# Patient Record
Sex: Female | Born: 2005 | Race: Black or African American | Hispanic: No | Marital: Single | Smoking: Never smoker
Health system: Southern US, Community
[De-identification: ages and names within clinical notes are randomized; demographics above are authoritative.]

## PROBLEM LIST (undated history)

## (undated) HISTORY — PX: MOUTH SURGERY: SHX715

---
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
## 2023-03-27 ENCOUNTER — Telehealth: Payer: Self-pay | Admitting: Internal Medicine

## 2023-03-27 NOTE — Telephone Encounter (Signed)
Good afternoon Dr. Leonides Schanz  The following patient is coming to [place] for vomiting and stomach pain since adolescence. She has seen [facility] for an OV once and is looking to transfer because as a child she wasn't given much help. She has requested you as her provider. Records are available on Epic. Please review and advise of scheduling. Thank you

## 2023-03-27 NOTE — Telephone Encounter (Signed)
Dr Leonides Schanz  Is it okay for her to schedule with an APP for an earlier appointment since your first available is in November?

## 2023-04-04 ENCOUNTER — Ambulatory Visit: Payer: Medicaid Other | Admitting: Physician Assistant

## 2023-06-12 ENCOUNTER — Ambulatory Visit: Payer: Medicaid Other | Admitting: Physician Assistant

## 2023-06-12 ENCOUNTER — Encounter: Payer: Self-pay | Admitting: Physician Assistant

## 2023-06-12 VITALS — BP 120/60 | HR 62 | Ht 62.0 in | Wt 122.0 lb

## 2023-06-12 DIAGNOSIS — R630 Anorexia: Secondary | ICD-10-CM | POA: Diagnosis not present

## 2023-06-12 DIAGNOSIS — R1013 Epigastric pain: Secondary | ICD-10-CM

## 2023-06-12 DIAGNOSIS — R112 Nausea with vomiting, unspecified: Secondary | ICD-10-CM | POA: Diagnosis not present

## 2023-06-12 DIAGNOSIS — R634 Abnormal weight loss: Secondary | ICD-10-CM

## 2023-06-12 MED ORDER — ONDANSETRON HCL 4 MG PO TABS: 4.0000 mg | ORAL_TABLET | Freq: Three times a day (TID) | ORAL | 2 refills | Status: AC | PRN

## 2023-06-12 NOTE — Progress Notes (Signed)
Chief Complaint: Abdominal pain and vomiting  HPI:    Erika Coffey is a 17 year old African-American female with a past medical history as listed below, accepted by Dr. Leonides Schanz, who was referred to me by [facility] for a complaint of abdominal pain and vomiting.      01/14/2022 abdominal x-ray with nonspecific bowel gas pattern and small stool burden.    Today, patient presents to clinic with her mother who does assist with history.  They explain that she has had some issues with nausea and vomiting seemingly randomly over the past year and a half or so, initially this happened in April 2023 and was so severe that she could only suck on ice cubes without vomiting, even water made her vomit for a 2-week time span, ever since then she has had issues with off-and-on nausea and vomiting, had another severe episode in the fall 2023.  Apparently has been to the ER on multiple occasions and they have tried her on various medicines including Zofran, Phenergan and Reglan, not currently on anything now.  Tells me that she continues with nausea which seems to get worse on certain days and an epigastric abdominal pain that hurts even when someone puts pressure on her stomach.  Also occasionally heartburn and reflux.  Associated symptoms include a loss of appetite.  Also about a 20 pound weight loss over the past year.  Was having issues with constipation but is now regular.    Denies fever, chills or stool.  History reviewed. No pertinent past medical history.  Past Surgical History:  Procedure Laterality Date   MOUTH SURGERY      Current Outpatient Medications  Medication Sig Dispense Refill   ondansetron (ZOFRAN) 4 MG tablet Take 1 tablet (4 mg total) by mouth every 8 (eight) hours as needed for nausea or vomiting. 30 tablet 2   No current facility-administered medications for this visit.    Allergies as of 06/12/2023   (No Known Allergies)    Family History  Problem Relation Age of  Onset   Thyroid disease Mother    Diabetes Maternal Grandmother    COPD Maternal Grandmother    Heart failure Maternal Grandmother    Colon polyps Paternal Grandfather     Social History   Socioeconomic History   Marital status: Single    Spouse name: Not on file   Number of children: Not on file   Years of education: Not on file   Highest education level: Not on file  Occupational History   Not on file  Tobacco Use   Smoking status: Former    Types: Cigarettes   Smokeless tobacco: Never  Substance and Sexual Activity   Alcohol use: No   Drug use: No   Sexual activity: Not on file  Other Topics Concern   Not on file  Social History Narrative   Not on file   Social Determinants of Health   Financial Resource Strain: Not on File (02/24/2022)   Received from [employer], [employer]    Financial Resource Strain: 0  Food Insecurity: Not on File (05/31/2023)   Received from [facility] Insecurity    Food: 0  Transportation Needs: Not on File (02/24/2022)   Received from [place], [employer] Needs    Transportation: 0  Physical Activity: Not on File (02/24/2022)   Received from [place], [place]   Physical Activity    Physical Activity: 0  Stress: Not  on File (02/24/2022)   Received from [facility], [place]   Stress    Stress: 0  Social Connections: Not on File (05/19/2023)   Received from [employer]   Social Connections    Connectedness: 0  Intimate Partner Violence: Not on file    Review of Systems:    Constitutional: No fever or chills Skin: No rash  Cardiovascular: No chest pain Respiratory: No SOB Gastrointestinal: See HPI and otherwise negative Genitourinary: No dysuria Neurological: No headache, dizziness or syncope Musculoskeletal: No new muscle or joint pain Hematologic: No bleeding Psychiatric: No history of depression or anxiety   Physical Exam:  Vital signs: BP 120/60   Pulse 62   Ht 5\' 2"  (1.575 m)   Wt 122 lb (55.3 kg)   BMI  22.31 kg/m   Constitutional:   Pleasant AA female appears to be in NAD, Well developed, Well nourished, alert and cooperative Head:  Normocephalic and atraumatic. Eyes:   PEERL, EOMI. No icterus. Conjunctiva pink. Ears:  Normal auditory acuity. Neck:  Supple Throat: Oral cavity and pharynx without inflammation, swelling or lesion.  Respiratory: Respirations even and unlabored. Lungs clear to auscultation bilaterally.   No wheezes, crackles, or rhonchi.  Cardiovascular: Normal S1, S2. No MRG. Regular rate and rhythm. No peripheral edema, cyanosis or pallor.  Gastrointestinal:  Soft, nondistended, mild epigastric ttp. No rebound or guarding. Normal bowel sounds. No appreciable masses or hepatomegaly. Rectal:  Not performed.  Msk:  Symmetrical without gross deformities. Without edema, no deformity or joint abnormality.  Neurologic:  Alert and  oriented x4;  grossly normal neurologically.  Skin:   Dry and intact without significant lesions or rashes. Psychiatric: Demonstrates good judgement and reason without abnormal affect or behaviors.  RELEVANT LABS AND IMAGING: CBC    Component Value Date/Time   WBC 9.1 01/14/2022 1405   RBC 5.17 (H) 01/14/2022 1405   HGB 13.2 01/14/2022 1405   HGB 13.2 01/11/2022 1357   HCT 41.1 01/14/2022 1405   HCT 39.5 01/11/2022 1357   PLT 256 01/14/2022 1405   PLT 228 01/11/2022 1357   MCV 79.5 (L) 01/14/2022 1405   MCV 77 (L) 01/11/2022 1357   MCH 25.5 (L) 01/14/2022 1405   MCHC 32.1 01/14/2022 1405   RDW 13.2 01/14/2022 1405   RDW 13.6 01/11/2022 1357   LYMPHSABS 1.9 01/14/2022 1405   LYMPHSABS 0.5 (L) 01/11/2022 1357   MONOABS 0.9 01/14/2022 1405   EOSABS 0.0 01/14/2022 1405   EOSABS 0.0 01/11/2022 1357   BASOSABS 0.1 01/14/2022 1405   BASOSABS 0.0 01/11/2022 1357    CMP     Component Value Date/Time   NA 134 (L) 01/14/2022 1405   NA 136 01/11/2022 1357   K 3.4 (L) 01/14/2022 1405   CL 103 01/14/2022 1405   CO2 19 (L) 01/14/2022 1405    GLUCOSE 81 01/14/2022 1405   BUN 14 01/14/2022 1405   BUN 10 01/11/2022 1357   CREATININE 0.83 01/14/2022 1405   CALCIUM 9.3 01/14/2022 1405   PROT 8.4 (H) 01/14/2022 1405   PROT 7.9 01/11/2022 1357   ALBUMIN 4.6 01/14/2022 1405   ALBUMIN 4.7 01/11/2022 1357   AST 19 01/14/2022 1405   ALT 15 01/14/2022 1405   ALKPHOS 47 01/14/2022 1405   BILITOT 1.3 (H) 01/14/2022 1405   BILITOT 0.3 01/11/2022 1357   GFRNONAA >60 01/14/2022 1405    Assessment: 1.  Nausea and vomiting: Initially severe nausea and vomiting which recurred a few months later, now off and on with  epigastric pain, no real help from Reglan, Phenergan, Zofran in the past ; consider gastroparesis versus gastritis versus functional dyspepsia versus other  2.  Epigastric pain: With above, consider gastroparesis versus gastritis versus other 3.  Weight loss: 20 pounds over the past year related to above 4.  Appetite loss  Plan: 1.  Due to chronicity of issues and no real diagnosis scheduled patient for diagnostic EGD in the [facility] with Dr. Leonides Schanz per her request.  Did provide the patient a detailed list of risks for the procedure and she agrees to proceed. Patient is appropriate for endoscopic procedure(s) in the ambulatory ([facility]) setting.  2.  Prescribed Zofran 4 mg pills every 4-6 hours as needed for nausea #30 with 1 refill. 3.  Patient will need to follow-up after time of EGD to discuss therapy and findings, allow Dr. Leonides Schanz to schedule this after time of EGD. 4.  Patient to follow in clinic per recommendations after time of procedure.  Hyacinth Meeker, PA-C [facility] 06/12/2023, 9:32 AM  Cc: [facility]

## 2023-06-12 NOTE — Patient Instructions (Signed)
We have sent the following medications to your pharmacy for you to pick up at your convenience:  Zofran  You have been scheduled for an endoscopy. Please follow written instructions given to you at your visit today.  If you use inhalers (even only as needed), please bring them with you on the day of your procedure.  If you take any of the following medications, they will need to be adjusted prior to your procedure:   DO NOT TAKE 7 DAYS PRIOR TO TEST- Trulicity (dulaglutide) Ozempic, Wegovy (semaglutide) Mounjaro (tirzepatide) Bydureon Bcise (exanatide extended release)  DO NOT TAKE 1 DAY PRIOR TO YOUR TEST Rybelsus (semaglutide) Adlyxin (lixisenatide) Victoza (liraglutide) Byetta (exanatide) ___________________________________________________________________________

## 2023-06-13 NOTE — Progress Notes (Signed)
I agree with the assessment and plan as outlined by Ms. Lemmon. 

## 2023-06-15 ENCOUNTER — Encounter: Payer: Self-pay | Admitting: Internal Medicine

## 2023-06-15 ENCOUNTER — Ambulatory Visit: Payer: Medicaid Other | Admitting: Internal Medicine

## 2023-06-15 VITALS — BP 127/72 | HR 58 | Temp 98.4°F | Resp 24 | Ht 62.0 in | Wt 122.0 lb

## 2023-06-15 DIAGNOSIS — R1013 Epigastric pain: Secondary | ICD-10-CM | POA: Diagnosis not present

## 2023-06-15 DIAGNOSIS — K2980 Duodenitis without bleeding: Secondary | ICD-10-CM

## 2023-06-15 DIAGNOSIS — R112 Nausea with vomiting, unspecified: Secondary | ICD-10-CM | POA: Diagnosis not present

## 2023-06-15 DIAGNOSIS — R634 Abnormal weight loss: Secondary | ICD-10-CM

## 2023-06-15 MED ORDER — SODIUM CHLORIDE 0.9 % IV SOLN: 500.0000 mL | Freq: Once | INTRAVENOUS | Status: DC

## 2023-06-15 MED ORDER — OMEPRAZOLE 40 MG PO CPDR: 40.0000 mg | DELAYED_RELEASE_CAPSULE | Freq: Every day | ORAL | 3 refills | Status: DC

## 2023-06-15 NOTE — Progress Notes (Signed)
GASTROENTEROLOGY PROCEDURE H&P NOTE   Primary Care Physician: [facility]    Reason for Procedure:   N&V, epigastric ab pain, weight loss  Plan:    EGD  Patient is appropriate for endoscopic procedure(s) in the ambulatory ([facility]) setting.  The nature of the procedure, as well as the risks, benefits, and alternatives were carefully and thoroughly reviewed with the patient. Ample time for discussion and questions allowed. The patient understood, was satisfied, and agreed to proceed.     HPI: Erika Coffey is a 17 y.o. female who presents for EGD for evaluation of N&V, epigastric ab pain, and weight loss .  Patient was most recently seen in the Gastroenterology Clinic on 06/12/23.  No interval change in medical history since that appointment. Patient states that her last use of marijuana was in the beginning of this year. Please refer to that note for full details regarding GI history and clinical presentation.   History reviewed. No pertinent past medical history.  Past Surgical History:  Procedure Laterality Date   MOUTH SURGERY      Prior to Admission medications   Medication Sig Start Date End Date Taking? Authorizing Provider  ondansetron (ZOFRAN) 4 MG tablet Take 1 tablet (4 mg total) by mouth every 8 (eight) hours as needed for nausea or vomiting. Patient not taking: Reported on 06/15/2023 06/12/23   Unk Lightning, PA    Current Outpatient Medications  Medication Sig Dispense Refill   ondansetron (ZOFRAN) 4 MG tablet Take 1 tablet (4 mg total) by mouth every 8 (eight) hours as needed for nausea or vomiting. (Patient not taking: Reported on 06/15/2023) 30 tablet 2   Current Facility-Administered Medications  Medication Dose Route Frequency Provider Last Rate Last Admin   0.9 %  sodium chloride infusion  500 mL Intravenous Once Imogene Burn, MD        Allergies as of 06/15/2023   (No Known Allergies)    Family History   Problem Relation Age of Onset   Thyroid disease Mother    Diabetes Maternal Grandmother    COPD Maternal Grandmother    Heart failure Maternal Grandmother    Colon polyps Paternal Grandfather     Social History   Socioeconomic History   Marital status: Single    Spouse name: Not on file   Number of children: Not on file   Years of education: Not on file   Highest education level: Not on file  Occupational History   Not on file  Tobacco Use   Smoking status: Former    Types: Cigarettes   Smokeless tobacco: Never  Substance and Sexual Activity   Alcohol use: No   Drug use: No   Sexual activity: Not on file  Other Topics Concern   Not on file  Social History Narrative   Not on file   Social Determinants of Health   Financial Resource Strain: Not on File (02/24/2022)   Received from [employer], [employer]    Financial Resource Strain: 0  Food Insecurity: Not on File (05/31/2023)   Received from [facility] Insecurity    Food: 0  Transportation Needs: Not on File (02/24/2022)   Received from [place], [employer] Needs    Transportation: 0  Physical Activity: Not on File (02/24/2022)   Received from [place], [place]   Physical Activity    Physical Activity: 0  Stress: Not on File (02/24/2022)   Received from  [facility], [place]   Stress    Stress: 0  Social Connections: Not on File (05/19/2023)   Received from [facility]   Social Connections    Connectedness: 0  Intimate Partner Violence: Not on file    Physical Exam: Vital signs in last 24 hours: BP (!) 106/55   Pulse 60   Temp 98.4 F (36.9 C) (Temporal)   Ht 5\' 2"  (1.575 m)   Wt 122 lb (55.3 kg)   LMP 06/09/2023 (Approximate)   SpO2 99%   BMI 22.31 kg/m  GEN: NAD EYE: Sclerae anicteric ENT: MMM CV: Non-tachycardic Pulm: No increased WOB GI: Soft NEURO:  Alert & Oriented   Eulah Pont, MD [facility]   06/15/2023 9:48 AM

## 2023-06-15 NOTE — Progress Notes (Signed)
Report given to PACU, vss 

## 2023-06-15 NOTE — Progress Notes (Signed)
Called to room to assist during endoscopic procedure.  Patient ID and intended procedure confirmed with present staff. Received instructions for my participation in the procedure from the performing physician.  

## 2023-06-15 NOTE — Progress Notes (Signed)
Pt's states no medical or surgical changes since previsit or office visit. 

## 2023-06-15 NOTE — Op Note (Signed)
[facility] Patient Name: Erika Coffey Procedure Date: 06/15/2023 9:57 AM MRN: [id number] Endoscopist: Particia Lather , , [id number] Age: 17 Referring MD:  Date of Birth: 03-11-2006 Gender: Female Account #: [id number] Procedure:                Upper GI endoscopy Indications:              Epigastric abdominal pain, Nausea with vomiting,                            Weight loss Medicines:                Monitored Anesthesia Care Procedure:                Pre-Anesthesia Assessment:                           - Prior to the procedure, a History and Physical                            was performed, and patient medications and                            allergies were reviewed. The patient's tolerance of                            previous anesthesia was also reviewed. The risks                            and benefits of the procedure and the sedation                            options and risks were discussed with the patient.                            All questions were answered, and informed consent                            was obtained. Prior Anticoagulants: The patient has                            taken no anticoagulant or antiplatelet agents. ASA                            Grade Assessment: I - A normal, healthy patient.                            After reviewing the risks and benefits, the patient                            was deemed in satisfactory condition to undergo the                            procedure.  After obtaining informed consent, the endoscope was                            passed under direct vision. Throughout the                            procedure, the patient's blood pressure, pulse, and                            oxygen saturations were monitored continuously. The                            GIF [id number] #[id number] was introduced through the                            mouth, and advanced to the second part of  duodenum.                            The upper GI endoscopy was accomplished without                            difficulty. The patient tolerated the procedure                            well. Scope In: Scope Out: Findings:                 White nummular lesions were noted in the mid                            esophagus and in the distal esophagus. Biopsies                            were taken with a cold forceps for histology.                           Retained fluid was found in the gastric body.                           Localized mildly erythematous mucosa without                            bleeding was found in the gastric antrum. Biopsies                            were taken with a cold forceps for histology.                           The examined duodenum was normal. Biopsies were                            taken with a cold forceps for histology. Complications:            No immediate complications. Estimated Blood Loss:     Estimated blood loss was minimal.  Impression:               - White nummular lesions in esophageal mucosa.                            Biopsied.                           - Retained gastric fluid.                           - Erythematous mucosa in the antrum. Biopsied.                           - Normal examined duodenum. Biopsied. Recommendation:           - Discharge patient to home (with escort).                           - Await pathology results.                           - Use Prilosec (omeprazole) 40 mg PO daily.                           - Return to GI clinic in 2-3 months.                           - The findings and recommendations were discussed                            with the patient. Dr Particia Lather "Alan Ripper" Leonides Schanz,  06/15/2023 10:12:06 AM

## 2023-06-15 NOTE — Progress Notes (Signed)
0957 Robinul 0.1 mg IV given due large amount of secretions upon assessment.  MD made aware, vss

## 2023-06-15 NOTE — Patient Instructions (Signed)
Prescription sent for omeprazole 40 mg daily to preferred pharmacy.    YOU HAD AN ENDOSCOPIC PROCEDURE TODAY AT THE [facility]:   Refer to the procedure report that was given to you for any specific questions about what was found during the examination.  If the procedure report does not answer your questions, please call your gastroenterologist to clarify.  If you requested that your care partner not be given the details of your procedure findings, then the procedure report has been included in a sealed envelope for you to review at your convenience later.  YOU SHOULD EXPECT: Some feelings of bloating in the abdomen. Passage of more gas than usual.  Walking can help get rid of the air that was put into your GI tract during the procedure and reduce the bloating. If you had a lower endoscopy (such as a colonoscopy or flexible sigmoidoscopy) you may notice spotting of blood in your stool or on the toilet paper. If you underwent a bowel prep for your procedure, you may not have a normal bowel movement for a few days.  Please Note:  You might notice some irritation and congestion in your nose or some drainage.  This is from the oxygen used during your procedure.  There is no need for concern and it should clear up in a day or so.  SYMPTOMS TO REPORT IMMEDIATELY:  Following lower endoscopy (colonoscopy or flexible sigmoidoscopy):  Excessive amounts of blood in the stool  Significant tenderness or worsening of abdominal pains  Swelling of the abdomen that is new, acute  Fever of 100F or higher  Following upper endoscopy (EGD)  Vomiting of blood or coffee ground material  New chest pain or pain under the shoulder blades  Painful or persistently difficult swallowing  New shortness of breath  Fever of 100F or higher  Black, tarry-looking stools  For urgent or emergent issues, a gastroenterologist can be reached at any hour by calling [phone]. Do not use MyChart messaging for  urgent concerns.    DIET:  We do recommend a small meal at first, but then you may proceed to your regular diet.  Drink plenty of fluids but you should avoid alcoholic beverages for 24 hours.  ACTIVITY:  You should plan to take it easy for the rest of today and you should NOT DRIVE or use heavy machinery until tomorrow (because of the sedation medicines used during the test).    FOLLOW UP: Our staff will call the number listed on your records the next business day following your procedure.  We will call around 7:15- 8:00 am to check on you and address any questions or concerns that you may have regarding the information given to you following your procedure. If we do not reach you, we will leave a message.     If any biopsies were taken you will be contacted by phone or by letter within the next 1-3 weeks.  Please call us at [phone] if you have not heard about the biopsies in 3 weeks.    SIGNATURES/CONFIDENTIALITY: You and/or your care partner have signed paperwork which will be entered into your electronic medical record.  These signatures attest to the fact that that the information above on your After Visit Summary has been reviewed and is understood.  Full responsibility of the confidentiality of this discharge information lies with you and/or your care-partner.

## 2023-06-19 ENCOUNTER — Encounter: Payer: Self-pay | Admitting: Internal Medicine

## 2023-06-19 LAB — SURGICAL PATHOLOGY

## 2023-08-21 ENCOUNTER — Ambulatory Visit: Payer: Medicaid Other | Admitting: Internal Medicine

## 2023-08-21 VITALS — BP 100/74 | HR 76 | Ht 62.0 in | Wt 129.2 lb

## 2023-08-21 DIAGNOSIS — R1013 Epigastric pain: Secondary | ICD-10-CM | POA: Diagnosis not present

## 2023-08-21 DIAGNOSIS — R630 Anorexia: Secondary | ICD-10-CM | POA: Diagnosis not present

## 2023-08-21 DIAGNOSIS — K219 Gastro-esophageal reflux disease without esophagitis: Secondary | ICD-10-CM | POA: Diagnosis not present

## 2023-08-21 DIAGNOSIS — R112 Nausea with vomiting, unspecified: Secondary | ICD-10-CM | POA: Diagnosis not present

## 2023-08-21 MED ORDER — OMEPRAZOLE 40 MG PO CPDR
40.0000 mg | DELAYED_RELEASE_CAPSULE | Freq: Every day | ORAL | 3 refills | Status: AC
Start: 2023-08-21 — End: ?

## 2023-08-21 NOTE — Patient Instructions (Addendum)
We have sent the following medications to your pharmacy for you to pick up at your convenience: Omeprazole   Follow up in 6 months  _______________________________________________________  If your blood pressure at your visit was 140/90 or greater, please contact your primary care physician to follow up on this.  _______________________________________________________  If you are age 19 or older, your body mass index should be between 23-30. Your Body mass index is 23.63 kg/m. If this is out of the aforementioned range listed, please consider follow up with your Primary Care Provider.  If you are age 31 or younger, your body mass index should be between 19-25. Your Body mass index is 23.63 kg/m. If this is out of the aformentioned range listed, please consider follow up with your Primary Care Provider.   ________________________________________________________  The [facility] providers would like to encourage you to use [facility] to communicate with providers for non-urgent requests or questions.  Due to long hold times on the telephone, sending your provider a message by [facility] may be a faster and more efficient way to get a response.  Please allow 48 business hours for a response.  Please remember that this is for non-urgent requests.  _______________________________________________________  Thank you for entrusting me with your care and for choosing [facility], Dr. Eulah Pont

## 2023-08-21 NOTE — Progress Notes (Signed)
Chief Complaint: Abdominal pain and vomiting  HPI:    Erika Coffey is a 17 year old African-American female presents for follow up of abdominal pain and vomiting.    Interval History: She has been taking her omeprazole 40 mg every day. This medication has helped a lot with her GI symptoms. She is eating more. Her stomach only gets upset now if she eats more junk foods.  For instance she had chocolate covered strawberries, and this seemed to make her stomach upset. Denies N&V. Denies epigastric ab pain.   Wt Readings from Last 3 Encounters:  08/21/23 129 lb 3.2 oz (58.6 kg) (52%, Z= 0.05)*  06/15/23 122 lb (55.3 kg) (38%, Z= -0.29)*  06/12/23 122 lb (55.3 kg) (39%, Z= -0.29)*   * Growth percentiles are based on CDC (Girls, 2-20 Years) data.   No past medical history on file.  Past Surgical History:  Procedure Laterality Date   MOUTH SURGERY      Current Outpatient Medications  Medication Sig Dispense Refill   omeprazole (PRILOSEC) 40 MG capsule Take 1 capsule (40 mg total) by mouth daily. 90 capsule 3   ondansetron (ZOFRAN) 4 MG tablet Take 1 tablet (4 mg total) by mouth every 8 (eight) hours as needed for nausea or vomiting. (Patient not taking: Reported on 08/21/2023) 30 tablet 2   No current facility-administered medications for this visit.    Allergies as of 08/21/2023   (No Known Allergies)    Family History  Problem Relation Age of Onset   Thyroid disease Mother    Diabetes Maternal Grandmother    COPD Maternal Grandmother    Heart failure Maternal Grandmother    Colon polyps Paternal Grandfather     Social History   Socioeconomic History   Marital status: Single    Spouse name: Not on file   Number of children: Not on file   Years of education: Not on file   Highest education level: Not on file  Occupational History   Not on file  Tobacco Use   Smoking status: Former    Types: Cigarettes   Smokeless tobacco: Never  Substance and Sexual Activity    Alcohol use: No   Drug use: No   Sexual activity: Not on file  Other Topics Concern   Not on file  Social History Narrative   Not on file   Social Drivers of Health   Financial Resource Strain: Not on File (02/24/2022)   Received from [employer], [employer]    Financial Resource Strain: 0  Food Insecurity: Not on File (05/31/2023)   Received from [facility] Insecurity    Food: 0  Transportation Needs: Not on File (02/24/2022)   Received from [employer] [facility], [employer] Needs    Transportation: 0  Physical Activity: Not on File (02/24/2022)   Received from [place], [place]   Physical Activity    Physical Activity: 0  Stress: Not on File (02/24/2022)   Received from [facility], [place]   Stress    Stress: 0  Social Connections: Not on File (05/19/2023)   Received from [facility] [facility]   Social Connections    Connectedness: 0  Intimate Partner Violence: Not on file    Physical Exam:  Vital signs: BP 100/74   Pulse 76   Ht 5\' 2"  (1.575 m)   Wt 129 lb 3.2 oz (58.6 kg)   LMP 07/30/2023 (Approximate)   BMI 23.63 kg/m   Constitutional:   Pleasant AA female appears  to be in NAD, Well developed, Well nourished, alert and cooperative Head:  Normocephalic and atraumatic. Respiratory: No increased WOB Cardiovascular: Regular rate  Gastrointestinal:  Soft, nondistended, non-tender. No rebound or guarding. Normal bowel sounds. No appreciable masses or hepatomegaly. Neurologic:  Alert and  oriented x4;  grossly normal neurologically.  Psychiatric: Demonstrates good judgement and reason without abnormal affect or behaviors.  RELEVANT LABS AND IMAGING: CBC    Component Value Date/Time   WBC 9.1 01/14/2022 1405   RBC 5.17 (H) 01/14/2022 1405   HGB 13.2 01/14/2022 1405   HGB 13.2 01/11/2022 1357   HCT 41.1 01/14/2022 1405   HCT 39.5 01/11/2022 1357   PLT 256 01/14/2022 1405   PLT 228 01/11/2022 1357   MCV 79.5 (L) 01/14/2022 1405   MCV 77 (L) 01/11/2022 1357   MCH 25.5 (L)  01/14/2022 1405   MCHC 32.1 01/14/2022 1405   RDW 13.2 01/14/2022 1405   RDW 13.6 01/11/2022 1357   LYMPHSABS 1.9 01/14/2022 1405   LYMPHSABS 0.5 (L) 01/11/2022 1357   MONOABS 0.9 01/14/2022 1405   EOSABS 0.0 01/14/2022 1405   EOSABS 0.0 01/11/2022 1357   BASOSABS 0.1 01/14/2022 1405   BASOSABS 0.0 01/11/2022 1357    CMP     Component Value Date/Time   NA 134 (L) 01/14/2022 1405   NA 136 01/11/2022 1357   K 3.4 (L) 01/14/2022 1405   CL 103 01/14/2022 1405   CO2 19 (L) 01/14/2022 1405   GLUCOSE 81 01/14/2022 1405   BUN 14 01/14/2022 1405   BUN 10 01/11/2022 1357   CREATININE 0.83 01/14/2022 1405   CALCIUM 9.3 01/14/2022 1405   PROT 8.4 (H) 01/14/2022 1405   PROT 7.9 01/11/2022 1357   ALBUMIN 4.6 01/14/2022 1405   ALBUMIN 4.7 01/11/2022 1357   AST 19 01/14/2022 1405   ALT 15 01/14/2022 1405   ALKPHOS 47 01/14/2022 1405   BILITOT 1.3 (H) 01/14/2022 1405   BILITOT 0.3 01/11/2022 1357   GFRNONAA >60 01/14/2022 1405   KUB 01/14/22: IMPRESSION: Nonspecific bowel gas pattern.  Small stool burden in the colon  EGD 06/15/23: - White nummular lesions in esophageal mucosa. Biopsied. - Retained gastric fluid. - Erythematous mucosa in the antrum. Biopsied. - Normal examined duodenum. Biopsied. Path: 1. Surgical [P], duodenal biopsies :      DUODENAL MUCOSA WITH PRESERVED VILLOGLANDULAR ARCHITECTURE WITHOUT INCREASED      INTRAEPITHELIAL LYMPHOCYTES OR EVIDENCE OF ACTIVE INFLAMMATION.      NO EVIDENCE OF GLUTEN SENSITIVE ENTEROPATHY.      NEGATIVE FOR DYSPLASIA OR MALIGNANCY.      2. Surgical [P], gastric biopsies :      GASTRIC ANTRAL/OXYNTIC MUCOSA WITH NO SIGNIFICANT DIAGNOSTIC ALTERATION.      NO H.PYLORI IDENTIFIED ON H&E STAIN.      NEGATIVE FOR INTESTINAL METAPLASIA OR DYSPLASIA.      3. Surgical [P], esophageal biopsies :      ESOPHAGEAL SQUAMOUS MUCOSA WITH NO SIGNIFICANT DIAGNOSTIC ALTERATION.      ONE FOCUS OF PROMINENT SUBMUCOSAL LYMPHOID AGGREGATE.      NO  EVIDENCE OF INTRAEPITHELIAL EOSINOPHILS OR LYMPHOCYTES.      NEGATIVE FOR DYSPLASIA OR MALIGNANCY   Assessment: GERD  N&V - resolved Epigastric ab pain - resolved Weight loss - resolved Patient presents for follow-up of nausea, vomiting, and epigastric abdominal pain, which has resolved after being put on omeprazole daily.  This suggest to me that her symptoms are most likely related to uncontrolled reflux.  Patient does  note that certain foods seem to make her reflux symptoms worse.  I went over conservative strategies on how to follow a reflux friendly diet, including avoiding certain types of foods, not eating too late, and not eating a large quantity of food at 1 time.  Patient would like to remain on her current dose of omeprazole for now.  Can consider decreasing the dose of her omeprazole or switching her to Pepcid in the future if her symptoms continue to remain well-controlled Plan: - GERD handout - Continue omeprazole 40 mg every day. Refill  - RTC 6 months  Eulah Pont, MD  [facility] 08/21/2023, 1:33 PM  I spent 32 minutes of time, including in depth chart review, independent review of results as outlined above, communicating results with the patient directly, face-to-face time with the patient, coordinating care, and ordering studies and medications as appropriate, and documentation.

## 2023-11-20 ENCOUNTER — Encounter: Payer: Self-pay | Admitting: Internal Medicine
